# Patient Record
Sex: Male | Born: 1970 | Hispanic: Yes | Marital: Married | Smoking: Never smoker
Health system: Southern US, Community
[De-identification: ages and names within clinical notes are randomized; demographics above are authoritative.]

---

## 2005-05-04 ENCOUNTER — Emergency Department: Payer: Self-pay | Admitting: General Practice

## 2017-10-27 ENCOUNTER — Emergency Department
Admission: EM | Admit: 2017-10-27 | Discharge: 2017-10-27 | Disposition: A | Discharge status: Still In Hospital | Payer: Self-pay | Attending: Surgery | Admitting: Surgery

## 2017-10-27 ENCOUNTER — Other Ambulatory Visit: Payer: Self-pay

## 2017-10-27 ENCOUNTER — Emergency Department: Payer: Self-pay | Admitting: Certified Registered"

## 2017-10-27 ENCOUNTER — Emergency Department: Payer: Self-pay

## 2017-10-27 ENCOUNTER — Encounter
Admission: EM | Disposition: A | Discharge status: Still In Hospital | Payer: Self-pay | Source: Home / Self Care | Attending: Emergency Medicine

## 2017-10-27 ENCOUNTER — Encounter: Payer: Self-pay | Admitting: Emergency Medicine

## 2017-10-27 DIAGNOSIS — Y9389 Activity, other specified: Secondary | ICD-10-CM | POA: Insufficient documentation

## 2017-10-27 DIAGNOSIS — S61019A Laceration without foreign body of unspecified thumb without damage to nail, initial encounter: Secondary | ICD-10-CM

## 2017-10-27 DIAGNOSIS — S61011A Laceration without foreign body of right thumb without damage to nail, initial encounter: Secondary | ICD-10-CM

## 2017-10-27 DIAGNOSIS — S62501B Fracture of unspecified phalanx of right thumb, initial encounter for open fracture: Secondary | ICD-10-CM

## 2017-10-27 DIAGNOSIS — Z79899 Other long term (current) drug therapy: Secondary | ICD-10-CM | POA: Insufficient documentation

## 2017-10-27 DIAGNOSIS — S62511B Displaced fracture of proximal phalanx of right thumb, initial encounter for open fracture: Secondary | ICD-10-CM | POA: Insufficient documentation

## 2017-10-27 DIAGNOSIS — X58XXXA Exposure to other specified factors, initial encounter: Secondary | ICD-10-CM | POA: Insufficient documentation

## 2017-10-27 HISTORY — PX: OPEN REDUCTION INTERNAL FIXATION (ORIF) HAND: SHX5991

## 2017-10-27 SURGERY — OPEN REDUCTION INTERNAL FIXATION (ORIF) HAND
Anesthesia: General | Site: Thumb | Laterality: Right

## 2017-10-27 MED ORDER — GLYCOPYRROLATE 0.2 MG/ML IJ SOLN
INTRAMUSCULAR | Status: DC | PRN
  Administered 2017-10-27: 0.2 mg via INTRAVENOUS

## 2017-10-27 MED ORDER — LIDOCAINE HCL (CARDIAC) PF 100 MG/5ML IV SOSY
PREFILLED_SYRINGE | INTRAVENOUS | Status: DC | PRN
  Administered 2017-10-27: 50 mg via INTRAVENOUS

## 2017-10-27 MED ORDER — CEFAZOLIN SODIUM-DEXTROSE 1-4 GM/50ML-% IV SOLN
1.0000 g | Freq: Once | INTRAVENOUS | Status: AC
  Administered 2017-10-27: 1 g via INTRAVENOUS
  Filled 2017-10-27: qty 50

## 2017-10-27 MED ORDER — ONDANSETRON HCL 4 MG/2ML IJ SOLN
INTRAMUSCULAR | Status: AC
  Filled 2017-10-27: qty 2

## 2017-10-27 MED ORDER — DEXAMETHASONE SODIUM PHOSPHATE 10 MG/ML IJ SOLN
INTRAMUSCULAR | Status: DC | PRN
  Administered 2017-10-27: 5 mg via INTRAVENOUS

## 2017-10-27 MED ORDER — HYDROCODONE-ACETAMINOPHEN 5-325 MG PO TABS: 1.0000 | ORAL_TABLET | Freq: Four times a day (QID) | ORAL | 0 refills | Status: DC | PRN

## 2017-10-27 MED ORDER — FENTANYL CITRATE (PF) 100 MCG/2ML IJ SOLN
INTRAMUSCULAR | Status: AC
  Filled 2017-10-27: qty 2

## 2017-10-27 MED ORDER — SULFAMETHOXAZOLE-TRIMETHOPRIM 800-160 MG PO TABS: 1.0000 | ORAL_TABLET | Freq: Two times a day (BID) | ORAL | 0 refills | Status: AC

## 2017-10-27 MED ORDER — ROCURONIUM BROMIDE 50 MG/5ML IV SOLN
INTRAVENOUS | Status: AC
  Filled 2017-10-27: qty 1

## 2017-10-27 MED ORDER — CEFAZOLIN SODIUM-DEXTROSE 1-4 GM/50ML-% IV SOLN
1.0000 g | Freq: Once | INTRAVENOUS | Status: AC
  Administered 2017-10-27: 2 g via INTRAVENOUS

## 2017-10-27 MED ORDER — FENTANYL CITRATE (PF) 100 MCG/2ML IJ SOLN
INTRAMUSCULAR | Status: DC | PRN
  Administered 2017-10-27: 50 ug via INTRAVENOUS
  Administered 2017-10-27: 100 ug via INTRAVENOUS
  Administered 2017-10-27: 50 ug via INTRAVENOUS

## 2017-10-27 MED ORDER — CEFAZOLIN SODIUM 1 G IJ SOLR
INTRAMUSCULAR | Status: AC
  Filled 2017-10-27: qty 20

## 2017-10-27 MED ORDER — TETANUS-DIPHTH-ACELL PERTUSSIS 5-2.5-18.5 LF-MCG/0.5 IM SUSP
0.5000 mL | Freq: Once | INTRAMUSCULAR | Status: AC
  Administered 2017-10-27: 0.5 mL via INTRAMUSCULAR
  Filled 2017-10-27: qty 0.5

## 2017-10-27 MED ORDER — SUGAMMADEX SODIUM 200 MG/2ML IV SOLN
INTRAVENOUS | Status: AC
  Filled 2017-10-27: qty 2

## 2017-10-27 MED ORDER — HYDROMORPHONE HCL 1 MG/ML IJ SOLN
1.0000 mg | Freq: Once | INTRAMUSCULAR | Status: AC
  Administered 2017-10-27: 1 mg via INTRAVENOUS
  Filled 2017-10-27: qty 1

## 2017-10-27 MED ORDER — LIDOCAINE HCL (PF) 1 % IJ SOLN
10.0000 mL | Freq: Once | INTRAMUSCULAR | Status: AC
  Administered 2017-10-27: 10 mL
  Filled 2017-10-27: qty 10

## 2017-10-27 MED ORDER — FENTANYL CITRATE (PF) 100 MCG/2ML IJ SOLN
25.0000 ug | INTRAMUSCULAR | Status: DC | PRN
  Administered 2017-10-27 (×2): 25 ug via INTRAVENOUS

## 2017-10-27 MED ORDER — ONDANSETRON HCL 4 MG/2ML IJ SOLN
INTRAMUSCULAR | Status: DC | PRN
  Administered 2017-10-27: 4 mg via INTRAVENOUS

## 2017-10-27 MED ORDER — ROCURONIUM BROMIDE 100 MG/10ML IV SOLN
INTRAVENOUS | Status: DC | PRN
  Administered 2017-10-27: 25 mg via INTRAVENOUS

## 2017-10-27 MED ORDER — ONDANSETRON HCL 4 MG/2ML IJ SOLN: 4.0000 mg | Freq: Once | INTRAMUSCULAR | Status: DC | PRN

## 2017-10-27 MED ORDER — PROPOFOL 10 MG/ML IV BOLUS
INTRAVENOUS | Status: AC
  Filled 2017-10-27: qty 20

## 2017-10-27 MED ORDER — GLYCOPYRROLATE 0.2 MG/ML IJ SOLN
INTRAMUSCULAR | Status: AC
  Filled 2017-10-27: qty 1

## 2017-10-27 MED ORDER — PROPOFOL 10 MG/ML IV BOLUS
INTRAVENOUS | Status: DC | PRN
  Administered 2017-10-27: 200 mg via INTRAVENOUS

## 2017-10-27 MED ORDER — SUGAMMADEX SODIUM 200 MG/2ML IV SOLN
INTRAVENOUS | Status: DC | PRN
  Administered 2017-10-27: 200 mg via INTRAVENOUS

## 2017-10-27 MED ORDER — MIDAZOLAM HCL 2 MG/2ML IJ SOLN
INTRAMUSCULAR | Status: AC
  Filled 2017-10-27: qty 2

## 2017-10-27 MED ORDER — MIDAZOLAM HCL 2 MG/2ML IJ SOLN
INTRAMUSCULAR | Status: DC | PRN
  Administered 2017-10-27: 2 mg via INTRAVENOUS

## 2017-10-27 MED ORDER — LIDOCAINE HCL (PF) 2 % IJ SOLN
INTRAMUSCULAR | Status: AC
  Filled 2017-10-27: qty 10

## 2017-10-27 MED ORDER — LACTATED RINGERS IV SOLN
INTRAVENOUS | Status: DC | PRN
  Administered 2017-10-27: 15:00:00 via INTRAVENOUS

## 2017-10-27 MED ORDER — SUCCINYLCHOLINE CHLORIDE 20 MG/ML IJ SOLN
INTRAMUSCULAR | Status: DC | PRN
  Administered 2017-10-27: 100 mg via INTRAVENOUS

## 2017-10-27 SURGICAL SUPPLY — 46 items
BLADE SURG SZ10 CARB STEEL (BLADE) ×2 IMPLANT
BNDG COHESIVE 4X5 TAN STRL (GAUZE/BANDAGES/DRESSINGS) ×2 IMPLANT
BNDG ESMARK 4X12 TAN STRL LF (GAUZE/BANDAGES/DRESSINGS) ×2 IMPLANT
CANISTER SUCT 1200ML W/VALVE (MISCELLANEOUS) ×2 IMPLANT
CAST PADDING 3X4FT ST 30246 (SOFTGOODS) ×1
CHLORAPREP W/TINT 26ML (MISCELLANEOUS) IMPLANT
COVER PIN YLW 0.028-062 (MISCELLANEOUS) ×4 IMPLANT
CUFF TOURN 18 STER (MISCELLANEOUS) ×2 IMPLANT
CUFF TOURN 24 STER (MISCELLANEOUS) IMPLANT
DURAPREP 26ML APPLICATOR (WOUND CARE) ×2 IMPLANT
ELECT REM PT RETURN 9FT ADLT (ELECTROSURGICAL) ×2
ELECTRODE REM PT RTRN 9FT ADLT (ELECTROSURGICAL) ×1 IMPLANT
GLOVE BIO SURGEON STRL SZ8 (GLOVE) ×2 IMPLANT
GLOVE BIOGEL PI IND STRL 7.0 (GLOVE) ×3 IMPLANT
GLOVE BIOGEL PI INDICATOR 7.0 (GLOVE) ×3
GLOVE INDICATOR 8.0 STRL GRN (GLOVE) ×2 IMPLANT
GLOVE SURG ORTHO 8.5 STRL (GLOVE) ×2 IMPLANT
GOWN STRL REUS W/ TWL LRG LVL3 (GOWN DISPOSABLE) ×1 IMPLANT
GOWN STRL REUS W/ TWL XL LVL3 (GOWN DISPOSABLE) ×1 IMPLANT
GOWN STRL REUS W/TWL LRG LVL3 (GOWN DISPOSABLE) ×1
GOWN STRL REUS W/TWL XL LVL3 (GOWN DISPOSABLE) ×1
KIT TURNOVER KIT A (KITS) ×2 IMPLANT
LABEL OR SOLS (LABEL) ×2 IMPLANT
NDL SAFETY ECLIPSE 18X1.5 (NEEDLE) ×1 IMPLANT
NEEDLE HYPO 18GX1.5 SHARP (NEEDLE) ×1
NS IRRIG 1000ML POUR BTL (IV SOLUTION) ×2 IMPLANT
PACK EXTREMITY ARMC (MISCELLANEOUS) ×2 IMPLANT
PAD CAST CTTN 3X4 STRL (SOFTGOODS) ×1 IMPLANT
PAD CAST CTTN 4X4 STRL (SOFTGOODS) IMPLANT
PADDING CAST COTTON 4X4 STRL (SOFTGOODS)
SPLINT CAST 1 STEP 3X12 (MISCELLANEOUS) ×2 IMPLANT
SPONGE LAP 18X18 RF (DISPOSABLE) ×2 IMPLANT
STAPLER SKIN PROX 35W (STAPLE) IMPLANT
STOCKINETTE BIAS CUT 4 980044 (GAUZE/BANDAGES/DRESSINGS) ×2 IMPLANT
STOCKINETTE IMPERVIOUS 9X36 MD (GAUZE/BANDAGES/DRESSINGS) ×2 IMPLANT
SUT ETHILON 4-0 (SUTURE)
SUT ETHILON 4-0 FS2 18XMFL BLK (SUTURE)
SUT PROLENE 4 0 PS 2 18 (SUTURE) ×2 IMPLANT
SUT VIC AB 2-0 CT1 36 (SUTURE) IMPLANT
SUT VIC AB 4-0 SH 27 (SUTURE)
SUT VIC AB 4-0 SH 27XANBCTRL (SUTURE) IMPLANT
SUT VICRYL+ 3-0 36IN CT-1 (SUTURE) IMPLANT
SUTURE ETHLN 4-0 FS2 18XMF BLK (SUTURE) IMPLANT
SYR 10ML LL (SYRINGE) ×2 IMPLANT
WIRE Z .045 C-WIRE SPADE TIP (WIRE) ×4 IMPLANT
c-wire .045 spade (1.14mm) ×4 IMPLANT

## 2017-10-27 NOTE — ED Triage Notes (Signed)
Pt presents via pov with laceration to left thumb with active bleeding upon presentation. Pt states he was working with a piece of equipment at home and was injured while using it. Pt alert & oriented; nad noted.

## 2017-10-27 NOTE — Progress Notes (Signed)
Dr. Henrene Hawking aware of bp 144/96, attempting manual now.  Reading of 138/90

## 2017-10-27 NOTE — ED Notes (Signed)
Pt remains  Npo

## 2017-10-27 NOTE — Discharge Instructions (Addendum)
PATIENT INSTRUCTIONS POST-ANESTHESIA  IMMEDIATELY FOLLOWING SURGERY:  Do not drive or operate machinery for the first twenty four hours after surgery.  Do not make any important decisions for twenty four hours after surgery or while taking narcotic pain medications or sedatives.  If you develop intractable nausea and vomiting or a severe headache please notify your doctor immediately.  FOLLOW-UP:  Please make an appointment with your surgeon as instructed. You do not need to follow up with anesthesia unless specifically instructed to do so.  WOUND CARE INSTRUCTIONS (if applicable):  Keep a dry clean dressing on the anesthesia/puncture wound site if there is drainage.  Once the wound has quit draining you may leave it open to air.  Generally you should leave the bandage intact for twenty four hours unless there is drainage.  If the epidural site drains for more than 36-48 hours please call the anesthesia department.  QUESTIONS?:  Please feel free to call your physician or the hospital operator if you have any questions, and they will be happy to assist you.      Orthopedic discharge instructions: Keep splint dry and intact. Keep hand elevated above heart level. Apply ice to affected area frequently. Take ibuprofen 600-800 mg TID with meals for 7-10 days, then as necessary. Take pain medication as prescribed or ES Tylenol when needed.  Take antibiotic every 8 hours as prescribed. Return for follow-up in 3-5 days for dressing change.   Call [facility] at [phone] to schedule appointment with Horris Latino, PA-C

## 2017-10-27 NOTE — ED Notes (Signed)
Dr Joice Lofts in  Explained  Procedure   Spanish interpretor    Rafael in    At bedside   Pt verbalizes  Knowledge

## 2017-10-27 NOTE — Anesthesia Post-op Follow-up Note (Signed)
Anesthesia QCDR form completed.        

## 2017-10-27 NOTE — ED Notes (Signed)
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

## 2017-10-27 NOTE — ED Notes (Signed)
Last po intake this am 0930

## 2017-10-27 NOTE — Consult Note (Signed)
ORTHOPAEDIC CONSULTATION  REQUESTING PHYSICIAN: Nita Sickle, MD  Chief Complaint:   Right thumb injury.  History of Present Illness: Reginald Parks is a 47 y.o. male in otherwise good health who was working on a lawnmower this morning when a spring apparently broke and struck his thumb, resulting in a deformity to his thumb.  He presented to the emergency room where x-rays demonstrated an essentially transverse/short oblique shaft fracture of the proximal phalanx of his right thumb.  The fracture is open.  The wound was irrigated thoroughly and the emergency room and he was given a gram of Ancef.  He notes moderate pain in the thumb, but denies any numbness to his thumb tip.  The patient also denies any associated injuries.  History reviewed. No pertinent past medical history. History reviewed. No pertinent surgical history. Social History   Socioeconomic History  . Marital status: Married    Spouse name: Not on file  . Number of children: Not on file  . Years of education: Not on file  . Highest education level: Not on file  Occupational History  . Not on file  Social Needs  . Financial resource strain: Not on file  . Food insecurity:    Worry: Not on file    Inability: Not on file  . Transportation needs:    Medical: Not on file    Non-medical: Not on file  Tobacco Use  . Smoking status: Never Smoker  . Smokeless tobacco: Never Used  Substance and Sexual Activity  . Alcohol use: Yes  . Drug use: Never  . Sexual activity: Not on file  Lifestyle  . Physical activity:    Days per week: Not on file    Minutes per session: Not on file  . Stress: Not on file  Relationships  . Social connections:    Talks on phone: Not on file    Gets together: Not on file    Attends religious service: Not on file    Active member of club or organization: Not on file    Attends meetings of clubs or organizations: Not on  file    Relationship status: Not on file  Other Topics Concern  . Not on file  Social History Narrative  . Not on file   History reviewed. No pertinent family history. No Known Allergies Prior to Admission medications   Not on File   Dg Finger Thumb Right  Result Date: 10/27/2017 CLINICAL DATA:  Thumb caught by retracting wire EXAM: RIGHT THUMB 2+V COMPARISON:  None. FINDINGS: Frontal, oblique, and lateral views obtained. There is an obliquely oriented fracture through the distal aspect of the first proximal phalanx with dorsal displacement and lateral angulation distally. No other fractures are evident. No dislocation. Joint spaces appear normal. No radiopaque foreign body or soft tissue air. IMPRESSION: Obliquely oriented fracture of the distal aspect of the fifth proximal phalanx with displacement and angulation at the fracture site. No other fractures are evident. No dislocation. No evident arthropathy. No radiopaque foreign body. Electronically Signed   By: Bretta Bang III M.D.   On: 10/27/2017 13:20    Positive ROS: All other systems have been reviewed and were otherwise negative with the exception of those mentioned in the HPI and as above.  Physical Exam: General:  Alert, no acute distress Psychiatric:  Patient is competent for consent with normal mood and affect   Cardiovascular:  No pedal edema Respiratory:  No wheezing, non-labored breathing GI:  Abdomen is soft and non-tender  Skin:  No lesions in the area of chief complaint Neurologic:  Sensation intact distally Lymphatic:  No axillary or cervical lymphadenopathy  Orthopedic Exam:  Orthopedic examination is limited to the right hand.  Skin inspection is notable for significant grease and dirt over his hand extending into the forearm region.  There is an obliquely oriented 1.5 cm laceration over the volo-ulnar aspect of the thumb midway between the MCP and IP joints.  The patient is able to actively flex and extend the  MCP and IP joints, although with pain.  Sensation is intact to light touch to the radial and ulnar aspects of his thumb tip.  He has good capillary refill to his thumb tip.  X-rays:  Recent AP, lateral, and oblique x-rays of the right thumb are available for review, and I reviewed them myself.  These films demonstrate a short oblique essentially transverse fracture through the proximal phalangeal shaft of the right thumb with displacement.  The fracture does not appear to involve either the IP or the MCP joints.  No lytic lesions or other bony abnormalities are identified.  Assessment: Open P1 fracture right thumb.  Plan: Treatment options have been discussed with the patient, including both surgical and nonsurgical options.  A medical interpreter was present for this discussion.  The patient would like to proceed with surgical intervention to include irrigation and debridement of the right thumb wound/fracture with reduction and percutaneous pinning of the P1 fracture.  The procedure has been discussed in detail with the patient, as have the potential risks (including bleeding, infection, nerve and blood vessel injury, persistent recurrent pain, stiffness of the thumb, malunion and/or nonunion, need for further surgery, blood clots, strokes, heart attacks and arrhythmias, etc.) and benefits.  The patient states his understanding and wishes to proceed.  A formal written consent has been obtained.  The patient last ate at 9:20 this morning.  However, I feel this is an emergency as the fracture is open and there is significant contamination of the hand.  I have discussed this with both the anesthesiologist and the patient, and both are agreeable to proceed as soon as possible.   Maryagnes Amos, MD  Beeper #:  [phone]  10/27/2017 2:28 PM

## 2017-10-27 NOTE — ED Provider Notes (Signed)
[facility] Emergency Department Provider Note  ____________________________________________   First MD Initiated Contact with Patient 10/27/17 1156     (approximate)  I have reviewed the triage vital signs and the nursing notes.   HISTORY  Chief Complaint Extremity Laceration   HPI Reginald Parks is a 47 y.o. male presents to the ED with complaint of laceration to his right thumb.  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  States that movement increases his pain.  He is uncertain as to his last tetanus.  Patient is right-hand dominant.  He rates his pain as a 5/10.  History reviewed. No pertinent past medical history.  There are no active problems to display for this patient.   History reviewed. No pertinent surgical history.  Prior to Admission medications   Medication Sig Start Date End Date Taking? Authorizing Provider  HYDROcodone-acetaminophen (NORCO/VICODIN) 5-325 MG tablet Take 1-2 tablets by mouth every 6 (six) hours as needed for moderate pain. 10/27/17   Poggi, Excell Seltzer, MD  sulfamethoxazole-trimethoprim (BACTRIM DS,SEPTRA DS) 800-160 MG tablet Take 1 tablet by mouth 2 (two) times daily. 10/27/17   Poggi, Excell Seltzer, MD    Allergies Patient has no known allergies.  History reviewed. No pertinent family history.  Social History Social History   Tobacco Use  . Smoking status: Never Smoker  . Smokeless tobacco: Never Used  Substance Use Topics  . Alcohol use: Yes  . Drug use: Never    Review of Systems Constitutional: No fever/chills Cardiovascular: Denies chest pain. Musculoskeletal: Positive right thumb pain. Skin: Positive laceration right thumb. Neurological: Negative for headaches, focal weakness or numbness. ___________________________________________   PHYSICAL EXAM:  VITAL SIGNS: ED Triage Vitals  Enc Vitals Group     BP 10/27/17 1135 133/90   Pulse Rate 10/27/17 1135 74     Resp 10/27/17 1135 18     Temp 10/27/17 1135 98.9 F (37.2 C)     Temp Source 10/27/17 1135 Oral     SpO2 10/27/17 1135 100 %     Weight 10/27/17 1136 200 lb (90.7 kg)     Height 10/27/17 1136 5\' 5"  (1.651 m)     Head Circumference --      Peak Flow --      Pain Score 10/27/17 1136 5     Pain Loc --      Pain Edu? --      Excl. in GC? --     Constitutional: Alert and oriented. Well appearing and in no acute distress. Eyes: Conjunctivae are normal.  Head: Atraumatic. Neck: No stridor.   Cardiovascular: Normal rate, regular rhythm. Grossly normal heart sounds.  Good peripheral circulation. Respiratory: Normal respiratory effort.  No retractions. Lungs CTAB. Musculoskeletal: Examination of right thumb there is questionable deformity and soft tissue swelling present.  There is a 1 cm laceration without active bleeding on the medial aspect of the thumb.  No foreign body noted.  Patient is limited with range of motion.  Motor sensory function intact.  Capillary refill is less than 3 seconds.  Skin of the right hand is covered with grease.  No other cuts are appreciated presently. Neurologic:  Normal speech and language. No gross focal neurologic deficits are appreciated.  Skin:  Skin is warm, dry.  Psychiatric: Mood and affect are normal. Speech and behavior are normal.  ____________________________________________   LABS (all labs ordered are listed, but  only abnormal results are displayed)  Labs Reviewed - No data to display  RADIOLOGY  ED MD interpretation:  Right thumb fracture distal aspect of the proximal phalanx with displacement and angulation.  No foreign body.  Official radiology report(s): Dg Finger Thumb Right  Result Date: 10/27/2017 CLINICAL DATA:  Thumb caught by retracting wire EXAM: RIGHT THUMB 2+V COMPARISON:  None. FINDINGS: Frontal, oblique, and lateral views obtained. There is an obliquely oriented fracture through the distal  aspect of the first proximal phalanx with dorsal displacement and lateral angulation distally. No other fractures are evident. No dislocation. Joint spaces appear normal. No radiopaque foreign body or soft tissue air. IMPRESSION: Obliquely oriented fracture of the distal aspect of the fifth proximal phalanx with displacement and angulation at the fracture site. No other fractures are evident. No dislocation. No evident arthropathy. No radiopaque foreign body. Electronically Signed   By: Bretta Bang III M.D.   On: 10/27/2017 13:20    ____________________________________________   PROCEDURES  Procedure(s) performed: None  Procedures  Critical Care performed: No  ____________________________________________   INITIAL IMPRESSION / ASSESSMENT AND PLAN / ED COURSE  As part of my medical decision making, I reviewed the following data within the electronic MEDICAL RECORD NUMBER Notes from prior ED visits and [place] Controlled Substance Database  Patient presents to the ED with injury to his right thumb.  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  He has a small laceration and deformity to his right thumb.  X-ray revealed angulated fracture of the distal phalanx.  Patient received IV Ancef and Dilaudid.  Digital block was not performed due to patient's pain being controlled.  Dr. Joice Lofts who is on for orthopedics was called and will see the patient.  Arrangements were made for the patient to have reduction in the OR.  ____________________________________________   FINAL CLINICAL IMPRESSION(S) / ED DIAGNOSES  Final diagnoses:  Laceration of right thumb without foreign body without damage to nail, initial encounter  Open fracture dislocation of right thumb, initial encounter     ED Discharge Orders         Ordered    HYDROcodone-acetaminophen (NORCO/VICODIN) 5-325 MG tablet  Every 6 hours PRN     10/27/17 1441    sulfamethoxazole-trimethoprim (BACTRIM  DS,SEPTRA DS) 800-160 MG tablet  2 times daily     10/27/17 1441           Note:  This document was prepared using Dragon voice recognition software and may include unintentional dictation errors.    Tommi Rumps, PA-C 10/27/17 1811    Nita Sickle, MD 10/29/17 1520

## 2017-10-27 NOTE — ED Notes (Signed)
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

## 2017-10-27 NOTE — ED Notes (Signed)
Pt remains NPO.

## 2017-10-27 NOTE — Anesthesia Preprocedure Evaluation (Signed)
Anesthesia Evaluation  Patient identified by MRN, date of birth, ID band Patient awake, Patient confused and Patient unresponsive    Reviewed: Allergy & Precautions, NPO status , Patient's Chart, lab work & pertinent test results  History of Anesthesia Complications Negative for: history of anesthetic complications  Airway Mallampati: III       Dental   Pulmonary neg sleep apnea, neg COPD,           Cardiovascular (-) hypertension(-) Past MI and (-) CHF (-) dysrhythmias (-) Valvular Problems/Murmurs     Neuro/Psych neg Seizures    GI/Hepatic Neg liver ROS, neg GERD  ,  Endo/Other  neg diabetes  Renal/GU negative Renal ROS     Musculoskeletal   Abdominal   Peds  Hematology   Anesthesia Other Findings   Reproductive/Obstetrics                             Anesthesia Physical Anesthesia Plan  ASA: II and emergent  Anesthesia Plan: General   Post-op Pain Management:    Induction: Rapid sequence and Intravenous  PONV Risk Score and Plan: 2 and Dexamethasone and Ondansetron  Airway Management Planned: Oral ETT  Additional Equipment:   Intra-op Plan:   Post-operative Plan:   Informed Consent: I have reviewed the patients History and Physical, chart, labs and discussed the procedure including the risks, benefits and alternatives for the proposed anesthesia with the patient or authorized representative who has indicated his/her understanding and acceptance.     Plan Discussed with:   Anesthesia Plan Comments:         Anesthesia Quick Evaluation

## 2017-10-27 NOTE — ED Notes (Signed)
Spanish interpretor  In   Interpret but patient speaks  Good  [place]

## 2017-10-27 NOTE — Progress Notes (Signed)
Dr Henrene Hawking aware of elevated diastolic bp.  Will attempt manual bp reading after locating one available.

## 2017-10-27 NOTE — ED Notes (Signed)
Or team here  To take patient to surgery

## 2017-10-27 NOTE — ED Notes (Signed)
Dressing and  Thumb splint applied  r  Thumb

## 2017-10-27 NOTE — Op Note (Signed)
10/27/2017  4:03 PM  Patient:   Reginald Parks  Pre-Op Diagnosis:   Open displaced extra-articular P1 fracture, right thumb.  Post-Op Diagnosis:   Same  Procedure:   Irrigation and debridement with closed reduction and percutaneous pinning of P1 fracture, right thumb.  Surgeon:   Maryagnes Amos, MD  Assistant:   None  Anesthesia:   GET  Findings:   As above.  Complications:   None  Fluids:   400 cc crystalloid  EBL:   10 cc  UOP:   None  TT:   31 minutes at 250 mmHg  Drains:   None  Closure:   4-0 Prolene interrupted sutures  Implants:   0.045 K wires x2  Brief Clinical Note:   The patient is a 47 year old male who sustained the above-noted injury this morning while working on his lawnmower.  Apparently a spring gave way and smacked his thumb.  He presented to the emergency room where x-rays demonstrated the above-noted injury.  He presents at this time for definitive management of this injury.  Procedure:   The patient was brought into the operating room and lain in the supine position.  After adequate general endotracheal intubation and anesthesia were obtained, the patient's right hand was scrubbed thoroughly with a chlorhexidine soaked scrub brush as he had significant dirt and oil all over his hands and forearms.  The right hand and upper extremity was prepped with ChloraPrep solution before being draped sterilely.  Preoperative antibiotics were administered.  The wound on the ulnar aspect of the thumb was debrided sharply with a 15 blade before being irrigated thoroughly using 1 L of sterile saline solution using bulb syringe irrigation.  The fracture was reduced and the adequacy of reduction verified using FluoroScan imaging in AP and lateral projections.  The fracture was stabilized using crossed 0.045 K wires inserted from distal to proximal.  Again, the adequacy of hardware position and fracture reduction was verified in AP and lateral projections using FluoroScan  imaging and found to be excellent.  The pins were cut short and pin caps applied to the pins which were left outside the skin.  A sterile bulky dressing was applied to the thumb before the patient was placed into a thumb spica splint.  The patient was then awakened, extubated, and returned to the recovery room in satisfactory condition after tolerating the procedure well.

## 2017-10-27 NOTE — Anesthesia Postprocedure Evaluation (Signed)
Anesthesia Post Note  Patient: Reginald Parks  Procedure(s) Performed: OPEN REDUCTION INTERNAL FIXATION (ORIF) -RIGHT THUMB (Right Thumb)  Patient location during evaluation: PACU Anesthesia Type: General Level of consciousness: awake and alert Pain management: pain level controlled Vital Signs Assessment: post-procedure vital signs reviewed and stable Respiratory status: spontaneous breathing and respiratory function stable Cardiovascular status: stable Anesthetic complications: no     Last Vitals:  Vitals:   10/27/17 1701 10/27/17 1727  BP: 138/90 138/90  Pulse: 75 75  Resp: 10 14  Temp:    SpO2: 97% 96%    Last Pain:  Vitals:   10/27/17 1727  TempSrc:   PainSc: 3                  KEPHART,WILLIAM K

## 2017-10-27 NOTE — ED Notes (Signed)
Finger soaked.

## 2017-10-27 NOTE — Transfer of Care (Signed)
Immediate Anesthesia Transfer of Care Note  Patient: Reginald Parks  Procedure(s) Performed: OPEN REDUCTION INTERNAL FIXATION (ORIF) -RIGHT THUMB (Right Thumb)  Patient Location: PACU  Anesthesia Type:General  Level of Consciousness: awake  Airway & Oxygen Therapy: Patient Spontanous Breathing and Patient connected to face mask oxygen  Post-op Assessment: Report given to RN and Post -op Vital signs reviewed and stable  Post vital signs: Reviewed  Last Vitals:  Vitals Value Taken Time  BP 137/97 10/27/2017  4:05 PM  Temp    Pulse 82 10/27/2017  4:04 PM  Resp 17 10/27/2017  4:04 PM  SpO2 92 % 10/27/2017  4:04 PM  Vitals shown include unvalidated device data.  Last Pain:  Vitals:   10/27/17 1420  TempSrc: Oral  PainSc:          Complications: No apparent anesthesia complications

## 2017-10-27 NOTE — Progress Notes (Signed)
Gabriel Rainwater. At bedside for discharge instructions.

## 2017-10-27 NOTE — Anesthesia Procedure Notes (Signed)
Procedure Name: Intubation Performed by: Mathews Argyle, CRNA Pre-anesthesia Checklist: Patient identified, Patient being monitored, Timeout performed, Emergency Drugs available and Suction available Patient Re-evaluated:Patient Re-evaluated prior to induction Oxygen Delivery Method: Circle system utilized Preoxygenation: Pre-oxygenation with 100% oxygen Induction Type: IV induction, Rapid sequence and Cricoid Pressure applied Laryngoscope Size: 3 and McGraph Grade View: Grade I Tube type: Oral Tube size: 7.5 mm Number of attempts: 1 Airway Equipment and Method: Stylet and Video-laryngoscopy Placement Confirmation: ETT inserted through vocal cords under direct vision,  positive ETCO2 and breath sounds checked- equal and bilateral Secured at: 21 cm Tube secured with: Tape Dental Injury: Teeth and Oropharynx as per pre-operative assessment

## 2017-10-29 ENCOUNTER — Encounter: Payer: Self-pay | Admitting: Surgery

## 2019-10-24 ENCOUNTER — Emergency Department: Payer: Self-pay

## 2019-10-24 ENCOUNTER — Other Ambulatory Visit: Payer: Self-pay

## 2019-10-24 ENCOUNTER — Encounter: Payer: Self-pay | Admitting: *Deleted

## 2019-10-24 ENCOUNTER — Emergency Department
Admission: EM | Admit: 2019-10-24 | Discharge: 2019-10-24 | Disposition: A | Discharge status: Home / Self Care | Payer: Self-pay | Attending: Emergency Medicine | Admitting: Emergency Medicine

## 2019-10-24 DIAGNOSIS — M25531 Pain in right wrist: Secondary | ICD-10-CM

## 2019-10-24 DIAGNOSIS — Z20822 Contact with and (suspected) exposure to covid-19: Secondary | ICD-10-CM | POA: Insufficient documentation

## 2019-10-24 DIAGNOSIS — S52352A Displaced comminuted fracture of shaft of radius, left arm, initial encounter for closed fracture: Secondary | ICD-10-CM | POA: Insufficient documentation

## 2019-10-24 DIAGNOSIS — W208XXA Other cause of strike by thrown, projected or falling object, initial encounter: Secondary | ICD-10-CM | POA: Insufficient documentation

## 2019-10-24 DIAGNOSIS — Y9389 Activity, other specified: Secondary | ICD-10-CM | POA: Insufficient documentation

## 2019-10-24 DIAGNOSIS — S61412A Laceration without foreign body of left hand, initial encounter: Secondary | ICD-10-CM | POA: Insufficient documentation

## 2019-10-24 DIAGNOSIS — S60221A Contusion of right hand, initial encounter: Secondary | ICD-10-CM | POA: Insufficient documentation

## 2019-10-24 DIAGNOSIS — M79642 Pain in left hand: Secondary | ICD-10-CM

## 2019-10-24 LAB — BASIC METABOLIC PANEL
Anion gap: 9 (ref 5–15)
BUN: 22 mg/dL — ABNORMAL HIGH (ref 6–20)
CO2: 26 mmol/L (ref 22–32)
Calcium: 8.5 mg/dL — ABNORMAL LOW (ref 8.9–10.3)
Chloride: 103 mmol/L (ref 98–111)
Creatinine, Ser: 0.65 mg/dL (ref 0.61–1.24)
GFR calc Af Amer: >60 mL/min (ref >60)
GFR calc non Af Amer: >60 mL/min (ref >60)
Glucose, Bld: 124 mg/dL — ABNORMAL HIGH (ref 70–99)
Potassium: 3.6 mmol/L (ref 3.5–5.1)
Sodium: 138 mmol/L (ref 135–145)

## 2019-10-24 LAB — CBC
HCT: 36.9 % — ABNORMAL LOW (ref 39.0–52.0)
Hemoglobin: 12.9 g/dL — ABNORMAL LOW (ref 13.0–17.0)
MCH: 31.4 pg (ref 26.0–34.0)
MCHC: 35 g/dL (ref 30.0–36.0)
MCV: 89.8 fL (ref 80.0–100.0)
Platelets: 262 10*3/uL (ref 150–400)
RBC: 4.11 MIL/uL — ABNORMAL LOW (ref 4.22–5.81)
RDW: 14 % (ref 11.5–15.5)
WBC: 10.2 10*3/uL (ref 4.0–10.5)
nRBC: 0 % (ref 0.0–0.2)

## 2019-10-24 LAB — RESPIRATORY PANEL BY RT PCR (FLU A&B, COVID)
Influenza A by PCR: NEGATIVE
Influenza B by PCR: NEGATIVE
SARS Coronavirus 2 by RT PCR: NEGATIVE

## 2019-10-24 MED ORDER — LIDOCAINE HCL (PF) 1 % IJ SOLN
INTRAMUSCULAR | Status: AC
  Administered 2019-10-24: 5 mL via INTRADERMAL
  Filled 2019-10-24: qty 5

## 2019-10-24 MED ORDER — CEFAZOLIN SODIUM-DEXTROSE 2-4 GM/100ML-% IV SOLN
2.0000 g | Freq: Once | INTRAVENOUS | Status: AC
  Administered 2019-10-24: 2 g via INTRAVENOUS
  Filled 2019-10-24 (×2): qty 100

## 2019-10-24 MED ORDER — FENTANYL CITRATE (PF) 100 MCG/2ML IJ SOLN
50.0000 ug | Freq: Once | INTRAMUSCULAR | Status: AC
  Administered 2019-10-24: 50 ug via INTRAVENOUS
  Filled 2019-10-24: qty 2

## 2019-10-24 MED ORDER — CEPHALEXIN 500 MG PO CAPS: 500.0000 mg | ORAL_CAPSULE | Freq: Three times a day (TID) | ORAL | 0 refills | Status: AC

## 2019-10-24 MED ORDER — ONDANSETRON 4 MG PO TBDP: 4.0000 mg | ORAL_TABLET | Freq: Three times a day (TID) | ORAL | 0 refills | Status: AC | PRN

## 2019-10-24 MED ORDER — LIDOCAINE HCL 1 % IJ SOLN: 5.0000 mL | Freq: Once | INTRAMUSCULAR | Status: AC

## 2019-10-24 MED ORDER — BUPIVACAINE HCL 0.25 % IJ SOLN
10.0000 mL | Freq: Once | INTRAMUSCULAR | Status: AC
  Administered 2019-10-24: 10 mL
  Filled 2019-10-24: qty 10

## 2019-10-24 MED ORDER — LIDOCAINE HCL (PF) 1 % IJ SOLN: 10.0000 mL | Freq: Once | INTRAMUSCULAR | Status: DC

## 2019-10-24 MED ORDER — LIDOCAINE HCL (PF) 1 % IJ SOLN: 10.0000 mL | Freq: Once | INTRAMUSCULAR | Status: AC

## 2019-10-24 MED ORDER — BUPIVACAINE HCL (PF) 0.5 % IJ SOLN: 10.0000 mL | Freq: Once | INTRAMUSCULAR | Status: DC

## 2019-10-24 MED ORDER — HYDROMORPHONE HCL 1 MG/ML IJ SOLN
1.0000 mg | Freq: Once | INTRAMUSCULAR | Status: AC
  Administered 2019-10-24: 1 mg via INTRAVENOUS
  Filled 2019-10-24: qty 1

## 2019-10-24 MED ORDER — SODIUM CHLORIDE 0.9 % IV SOLN: Freq: Once | INTRAVENOUS | Status: AC

## 2019-10-24 MED ORDER — OXYCODONE-ACETAMINOPHEN 5-325 MG PO TABS: 1.0000 | ORAL_TABLET | Freq: Four times a day (QID) | ORAL | 0 refills | Status: DC | PRN

## 2019-10-24 MED ORDER — LIDOCAINE HCL (PF) 1 % IJ SOLN
INTRAMUSCULAR | Status: AC
  Administered 2019-10-24: 5 mL
  Filled 2019-10-24: qty 5

## 2019-10-24 MED ORDER — CEFAZOLIN SODIUM-DEXTROSE 1-4 GM/50ML-% IV SOLN
1.0000 g | Freq: Once | INTRAVENOUS | Status: DC
  Filled 2019-10-24: qty 50

## 2019-10-24 NOTE — ED Notes (Signed)
See triage note for assessment 

## 2019-10-24 NOTE — ED Notes (Signed)
IVC/  PENDING  PLACEMENT 

## 2019-10-24 NOTE — Discharge Instructions (Signed)
Please call Dr. Rosita Kea office as soon as possible.  Take Keflex three times daily for the next seven days. Have sutures removed in seven days.  You have been prescribed percocet for pain.

## 2019-10-24 NOTE — ED Notes (Signed)
Interpreter [place] [phone] used for DC instructions.

## 2019-10-24 NOTE — ED Notes (Signed)
Size lg shoulder immobilizer applied to right shoulder.

## 2019-10-24 NOTE — ED Triage Notes (Signed)
Per EMS report, patient was working on a tire of a Paediatric nurse which then blew off the rim. Patient was thrown back on his back. Obvious deformity to right wrist, laceration on palm of right hand. Patient arrived with a splint on right wrist. Left hand is swollen with laceration on dorsum of left hand which is wrapped with gauze.
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
## 2019-10-24 NOTE — ED Notes (Signed)
Sensation intact on right and left hands.

## 2019-10-27 ENCOUNTER — Other Ambulatory Visit: Payer: Self-pay | Admitting: Orthopedic Surgery

## 2019-10-27 ENCOUNTER — Other Ambulatory Visit: Payer: Self-pay | Attending: Orthopedic Surgery

## 2019-10-28 ENCOUNTER — Ambulatory Visit: Payer: Self-pay | Admitting: Certified Registered"

## 2019-10-28 ENCOUNTER — Encounter
Admission: RE | Disposition: A | Discharge status: Home / Self Care | Payer: Self-pay | Source: Home / Self Care | Attending: Orthopedic Surgery

## 2019-10-28 ENCOUNTER — Ambulatory Visit
Admission: RE | Admit: 2019-10-28 | Discharge: 2019-10-28 | Disposition: A | Discharge status: Home / Self Care | Payer: Self-pay | Attending: Orthopedic Surgery | Admitting: Orthopedic Surgery

## 2019-10-28 ENCOUNTER — Encounter: Payer: Self-pay | Admitting: Orthopedic Surgery

## 2019-10-28 ENCOUNTER — Ambulatory Visit: Payer: Self-pay

## 2019-10-28 ENCOUNTER — Other Ambulatory Visit: Payer: Self-pay

## 2019-10-28 DIAGNOSIS — X58XXXA Exposure to other specified factors, initial encounter: Secondary | ICD-10-CM | POA: Insufficient documentation

## 2019-10-28 DIAGNOSIS — Y9389 Activity, other specified: Secondary | ICD-10-CM | POA: Insufficient documentation

## 2019-10-28 DIAGNOSIS — Z79899 Other long term (current) drug therapy: Secondary | ICD-10-CM | POA: Insufficient documentation

## 2019-10-28 DIAGNOSIS — S52551A Other extraarticular fracture of lower end of right radius, initial encounter for closed fracture: Secondary | ICD-10-CM | POA: Insufficient documentation

## 2019-10-28 DIAGNOSIS — Z8781 Personal history of (healed) traumatic fracture: Secondary | ICD-10-CM

## 2019-10-28 HISTORY — PX: OPEN REDUCTION INTERNAL FIXATION (ORIF) DISTAL RADIAL FRACTURE: SHX5989

## 2019-10-28 SURGERY — OPEN REDUCTION INTERNAL FIXATION (ORIF) DISTAL RADIUS FRACTURE
Anesthesia: General | Laterality: Right

## 2019-10-28 MED ORDER — DEXAMETHASONE SODIUM PHOSPHATE 10 MG/ML IJ SOLN
INTRAMUSCULAR | Status: AC
  Filled 2019-10-28: qty 1

## 2019-10-28 MED ORDER — MIDAZOLAM HCL 2 MG/2ML IJ SOLN
INTRAMUSCULAR | Status: AC
  Filled 2019-10-28: qty 2

## 2019-10-28 MED ORDER — FENTANYL CITRATE (PF) 100 MCG/2ML IJ SOLN
INTRAMUSCULAR | Status: AC
  Administered 2019-10-28: 25 ug via INTRAVENOUS
  Filled 2019-10-28: qty 2

## 2019-10-28 MED ORDER — LACTATED RINGERS IV SOLN: INTRAVENOUS | Status: DC

## 2019-10-28 MED ORDER — ONDANSETRON HCL 4 MG PO TABS: 4.0000 mg | ORAL_TABLET | Freq: Four times a day (QID) | ORAL | Status: DC | PRN

## 2019-10-28 MED ORDER — PROPOFOL 10 MG/ML IV BOLUS
INTRAVENOUS | Status: DC | PRN
  Administered 2019-10-28: 200 mg via INTRAVENOUS

## 2019-10-28 MED ORDER — DEXAMETHASONE SODIUM PHOSPHATE 10 MG/ML IJ SOLN
INTRAMUSCULAR | Status: DC | PRN
  Administered 2019-10-28: 10 mg via INTRAVENOUS

## 2019-10-28 MED ORDER — LIDOCAINE HCL (PF) 2 % IJ SOLN
INTRAMUSCULAR | Status: DC | PRN
  Administered 2019-10-28: 50 mg

## 2019-10-28 MED ORDER — FENTANYL CITRATE (PF) 100 MCG/2ML IJ SOLN
INTRAMUSCULAR | Status: AC
Start: 2019-10-28 — End: 2019-10-28
  Administered 2019-10-28: 25 ug via INTRAVENOUS
  Filled 2019-10-28: qty 2

## 2019-10-28 MED ORDER — METOCLOPRAMIDE HCL 5 MG/ML IJ SOLN: 5.0000 mg | Freq: Three times a day (TID) | INTRAMUSCULAR | Status: DC | PRN

## 2019-10-28 MED ORDER — SODIUM CHLORIDE 0.9 % IV SOLN: INTRAVENOUS | Status: DC

## 2019-10-28 MED ORDER — FENTANYL CITRATE (PF) 100 MCG/2ML IJ SOLN
25.0000 ug | INTRAMUSCULAR | Status: AC | PRN
  Administered 2019-10-28 (×4): 25 ug via INTRAVENOUS

## 2019-10-28 MED ORDER — CHLORHEXIDINE GLUCONATE 0.12 % MT SOLN
OROMUCOSAL | Status: AC
  Administered 2019-10-28: 15 mL via OROMUCOSAL
  Filled 2019-10-28: qty 15

## 2019-10-28 MED ORDER — GLYCOPYRROLATE 0.2 MG/ML IJ SOLN
INTRAMUSCULAR | Status: DC | PRN
  Administered 2019-10-28: .2 mg via INTRAVENOUS

## 2019-10-28 MED ORDER — ONDANSETRON HCL 4 MG/2ML IJ SOLN
INTRAMUSCULAR | Status: DC | PRN
  Administered 2019-10-28: 4 mg via INTRAVENOUS

## 2019-10-28 MED ORDER — MIDAZOLAM HCL 5 MG/5ML IJ SOLN
INTRAMUSCULAR | Status: DC | PRN
  Administered 2019-10-28: 2 mg via INTRAVENOUS

## 2019-10-28 MED ORDER — KETAMINE HCL 50 MG/ML IJ SOLN
INTRAMUSCULAR | Status: DC | PRN
  Administered 2019-10-28: 50 mg via INTRAMUSCULAR

## 2019-10-28 MED ORDER — METOCLOPRAMIDE HCL 10 MG PO TABS: 5.0000 mg | ORAL_TABLET | Freq: Three times a day (TID) | ORAL | Status: DC | PRN

## 2019-10-28 MED ORDER — ONDANSETRON HCL 4 MG/2ML IJ SOLN: 4.0000 mg | Freq: Once | INTRAMUSCULAR | Status: DC | PRN

## 2019-10-28 MED ORDER — OXYCODONE-ACETAMINOPHEN 5-325 MG PO TABS: 1.0000 | ORAL_TABLET | Freq: Four times a day (QID) | ORAL | 0 refills | Status: AC | PRN

## 2019-10-28 MED ORDER — KETOROLAC TROMETHAMINE 30 MG/ML IJ SOLN
INTRAMUSCULAR | Status: DC | PRN
  Administered 2019-10-28: 30 mg via INTRAVENOUS

## 2019-10-28 MED ORDER — CEFAZOLIN SODIUM-DEXTROSE 2-4 GM/100ML-% IV SOLN
INTRAVENOUS | Status: AC
  Filled 2019-10-28: qty 100

## 2019-10-28 MED ORDER — ORAL CARE MOUTH RINSE: 15.0000 mL | Freq: Once | OROMUCOSAL | Status: AC

## 2019-10-28 MED ORDER — KETOROLAC TROMETHAMINE 30 MG/ML IJ SOLN
INTRAMUSCULAR | Status: AC
  Filled 2019-10-28: qty 1

## 2019-10-28 MED ORDER — CHLORHEXIDINE GLUCONATE 0.12 % MT SOLN: 15.0000 mL | Freq: Once | OROMUCOSAL | Status: AC

## 2019-10-28 MED ORDER — FENTANYL CITRATE (PF) 100 MCG/2ML IJ SOLN
INTRAMUSCULAR | Status: DC | PRN
Start: 2019-10-28 — End: 2019-10-28
  Administered 2019-10-28 (×2): 50 ug via INTRAVENOUS

## 2019-10-28 MED ORDER — NEOMYCIN-POLYMYXIN B GU 40-200000 IR SOLN
Status: AC
  Filled 2019-10-28: qty 2

## 2019-10-28 MED ORDER — CEFAZOLIN SODIUM-DEXTROSE 2-4 GM/100ML-% IV SOLN
2.0000 g | INTRAVENOUS | Status: AC
  Administered 2019-10-28: 2 g via INTRAVENOUS

## 2019-10-28 MED ORDER — OXYCODONE HCL 5 MG/5ML PO SOLN: 5.0000 mg | Freq: Once | ORAL | Status: AC | PRN

## 2019-10-28 MED ORDER — OXYCODONE HCL 5 MG PO TABS
ORAL_TABLET | ORAL | Status: AC
  Administered 2019-10-28: 5 mg via ORAL
  Filled 2019-10-28: qty 1

## 2019-10-28 MED ORDER — OXYCODONE-ACETAMINOPHEN 5-325 MG PO TABS: 1.0000 | ORAL_TABLET | Freq: Four times a day (QID) | ORAL | 0 refills | Status: DC | PRN

## 2019-10-28 MED ORDER — ONDANSETRON HCL 4 MG/2ML IJ SOLN
INTRAMUSCULAR | Status: AC
  Filled 2019-10-28: qty 2

## 2019-10-28 MED ORDER — ONDANSETRON HCL 4 MG/2ML IJ SOLN: 4.0000 mg | Freq: Four times a day (QID) | INTRAMUSCULAR | Status: DC | PRN

## 2019-10-28 MED ORDER — FENTANYL CITRATE (PF) 100 MCG/2ML IJ SOLN
INTRAMUSCULAR | Status: AC
  Filled 2019-10-28: qty 2

## 2019-10-28 MED ORDER — OXYCODONE HCL 5 MG PO TABS: 5.0000 mg | ORAL_TABLET | Freq: Once | ORAL | Status: AC | PRN

## 2019-10-28 SURGICAL SUPPLY — 42 items
APL PRP STRL LF DISP 70% ISPRP (MISCELLANEOUS) ×1
BIT DRILL 2 FAST STEP (BIT) ×3 IMPLANT
BIT DRILL 2.5X4 QC (BIT) ×3 IMPLANT
BNDG ELASTIC 4X5.8 VLCR STR LF (GAUZE/BANDAGES/DRESSINGS) ×3 IMPLANT
CANISTER SUCT 1200ML W/VALVE (MISCELLANEOUS) ×3 IMPLANT
CHLORAPREP W/TINT 26 (MISCELLANEOUS) ×3 IMPLANT
COVER WAND RF STERILE (DRAPES) ×3 IMPLANT
CUFF TOURN SGL QUICK 18X4 (TOURNIQUET CUFF) IMPLANT
DRAPE FLUOR MINI C-ARM 54X84 (DRAPES) ×3 IMPLANT
ELECT REM PT RETURN 9FT ADLT (ELECTROSURGICAL) ×3
ELECTRODE REM PT RTRN 9FT ADLT (ELECTROSURGICAL) ×1 IMPLANT
GAUZE SPONGE 4X4 12PLY STRL (GAUZE/BANDAGES/DRESSINGS) ×3 IMPLANT
GAUZE XEROFORM 1X8 LF (GAUZE/BANDAGES/DRESSINGS) ×6 IMPLANT
GLOVE SURG SYN 9.0  PF PI (GLOVE) ×2
GLOVE SURG SYN 9.0 PF PI (GLOVE) ×1 IMPLANT
GOWN SRG 2XL LVL 4 RGLN SLV (GOWNS) ×1 IMPLANT
GOWN STRL NON-REIN 2XL LVL4 (GOWNS) ×3
GOWN STRL REUS W/ TWL LRG LVL3 (GOWN DISPOSABLE) ×1 IMPLANT
GOWN STRL REUS W/TWL LRG LVL3 (GOWN DISPOSABLE) ×3
K-WIRE 1.6 (WIRE) ×6
K-WIRE FX5X1.6XNS BN SS (WIRE) ×2
KIT TURNOVER KIT A (KITS) ×3 IMPLANT
KWIRE FX5X1.6XNS BN SS (WIRE) ×2 IMPLANT
NEEDLE FILTER BLUNT 18X 1/2SAF (NEEDLE) ×2
NEEDLE FILTER BLUNT 18X1 1/2 (NEEDLE) ×1 IMPLANT
NS IRRIG 500ML POUR BTL (IV SOLUTION) ×3 IMPLANT
PACK EXTREMITY (MISCELLANEOUS) ×3 IMPLANT
PAD CAST CTTN 4X4 STRL (SOFTGOODS) ×2 IMPLANT
PADDING CAST COTTON 4X4 STRL (SOFTGOODS) ×6
PEG SUBCHONDRAL SMOOTH 2.0X16 (Peg) ×6 IMPLANT
PEG SUBCHONDRAL SMOOTH 2.0X18 (Peg) ×3 IMPLANT
PEG SUBCHONDRAL SMOOTH 2.0X20 (Peg) ×3 IMPLANT
PEG SUBCHONDRAL SMOOTH 2.0X22 (Peg) ×6 IMPLANT
PEG SUBCHONDRAL SMOOTH 2.0X24 (Peg) ×3 IMPLANT
SCALPEL PROTECTED #15 DISP (BLADE) ×6 IMPLANT
SCREW CORT 3.5X14 LNG (Screw) ×6 IMPLANT
SPLINT CAST 1 STEP 3X12 (MISCELLANEOUS) ×3 IMPLANT
SUT ETHILON 4-0 (SUTURE) ×3
SUT ETHILON 4-0 FS2 18XMFL BLK (SUTURE) ×1
SUT VICRYL 3-0 27IN (SUTURE) ×3 IMPLANT
SUTURE ETHLN 4-0 FS2 18XMF BLK (SUTURE) ×1 IMPLANT
SYR 3ML LL SCALE MARK (SYRINGE) ×3 IMPLANT

## 2019-10-28 NOTE — OR Nursing (Signed)
Dr Rosita Kea in to see patient gave instructions to patient and family with interpreter at bedside. Patient verbalized understanding.

## 2019-10-28 NOTE — Anesthesia Procedure Notes (Signed)
Procedure Name: LMA Insertion Performed by: Pravin Perezperez, CRNA Pre-anesthesia Checklist: Patient identified, Patient being monitored, Timeout performed, Emergency Drugs available and Suction available Patient Re-evaluated:Patient Re-evaluated prior to induction Oxygen Delivery Method: Circle system utilized Preoxygenation: Pre-oxygenation with 100% oxygen Induction Type: IV induction Ventilation: Mask ventilation without difficulty LMA: LMA inserted LMA Size: 4.0 Tube type: Oral Number of attempts: 1 Placement Confirmation: positive ETCO2 and breath sounds checked- equal and bilateral Tube secured with: Tape Dental Injury: Teeth and Oropharynx as per pre-operative assessment        

## 2019-10-28 NOTE — Anesthesia Postprocedure Evaluation (Signed)
Anesthesia Post Note  Patient: Reginald Parks  Procedure(s) Performed: OPEN REDUCTION INTERNAL FIXATION (ORIF) DISTAL RADIAL FRACTURE (Right )  Patient location during evaluation: PACU Anesthesia Type: General Level of consciousness: awake and alert Pain management: pain level controlled Vital Signs Assessment: post-procedure vital signs reviewed and stable Respiratory status: spontaneous breathing, nonlabored ventilation and respiratory function stable Cardiovascular status: blood pressure returned to baseline and stable Postop Assessment: no apparent nausea or vomiting Anesthetic complications: no   No complications documented.   Last Vitals:  Vitals:   10/28/19 1248 10/28/19 1303  BP: (!) 143/92 (!) 143/84  Pulse: 66 69  Resp: 13 13  Temp:  (!) 36.1 C  SpO2: 94% 95%    Last Pain:  Vitals:   10/28/19 1303  TempSrc:   PainSc: 4                  Karleen Hampshire

## 2019-10-28 NOTE — Discharge Instructions (Addendum)
AMBULATORY SURGERY  DISCHARGE INSTRUCTIONS   1) The drugs that you were given will stay in your system until tomorrow so for the next 24 hours you should not:  A) Drive an automobile B) Make any legal decisions C) Drink any alcoholic beverage   2) You may resume regular meals tomorrow.  Today it is better to start with liquids and gradually work up to solid foods.  You may eat anything you prefer, but it is better to start with liquids, then soup and crackers, and gradually work up to solid foods.   3) Please notify your doctor immediately if you have any unusual bleeding, trouble breathing, redness and pain at the surgery site, drainage, fever, or pain not relieved by medication.    4) Additional Instructions:        Please contact your physician with any problems or Same Day Surgery at [phone], Monday through Friday 6 am to 4 pm, or [place] at [facility] number at [phone].Wrist Fracture Treated With ORIF, Care After This sheet gives you information about how to care for yourself after your procedure. Your health care provider may also give you more specific instructions. If you have problems or questions, contact your health care provider. What can I expect after the procedure? After the procedure, it is common to have:  Pain.  Swelling.  Stiffness.  A small amount of drainage from the incision. Follow these instructions at home: If you have a cast:  Do not stick anything inside the cast to scratch your skin. Doing that increases your risk of infection.  Check the skin around the cast every day. Tell your health care provider about any concerns.  You may put lotion on dry skin around the edges of the cast. Do not put lotion on the skin underneath the cast.  Keep the cast clean and dry. If you have a splint or sling:  Wear the splint or sling as told by your health care provider. Remove it only as told by your health care provider.  Loosen the  splint or sling if your fingers tingle, become numb, or turn cold and blue.  Keep the splint or sling clean and dry. Bathing  Do not take baths, swim, or use a hot tub until your health care provider approves. Ask your health care provider if you may take showers. You may only be allowed to take sponge baths.  If your cast, splint, or sling is not waterproof: ? Do not let it get wet. ? Cover it with a watertight covering when you take a bath or shower.  If you have a sling, remove it for bathing only if your health care provider tells you that it is safe to do so.  Keep the bandage (dressing) dry until your health care provider says it can be removed. Incision care   Follow instructions from your health care provider about how to take care of your incision. Make sure you: ? Wash your hands with soap and water before you change your bandage (dressing). If soap and water are not available, use hand sanitizer. ? Change your dressing as told by your health care provider. ? Leave stitches (sutures), skin glue, or adhesive strips in place. These skin closures may need to stay in place for 2 weeks or longer. If adhesive strip edges start to loosen and curl up, you may trim the loose edges. Do not remove adhesive strips completely unless your health care provider tells you to do that.  Check your incision area every day for signs of infection. Check for: ? Redness. ? More swelling or pain. ? Blood or more fluid. ? Warmth. ? Pus or a bad smell. Managing pain, stiffness, and swelling   If directed, put ice on the injured area. ? If you have a removable splint or sling, remove it as told by your health care provider. ? Put ice in a plastic bag. ? Place a towel between your skin and the bag or between your cast and the bag. ? Leave the ice on for 20 minutes, 2-3 times a day.  Move your fingers often to avoid stiffness and to lessen swelling.  Raise (elevate) the injured area above the  level of your heart while you are sitting or lying down. Driving  Do not drive or use heavy machinery while taking prescription pain medicine.  Do not drive for 24 hours if you were given a sedative during your procedure.  Ask your health care provider when it is safe to drive if you have a cast, splint, or sling on your wrist. Activity  Return to your normal activities as told by your health care provider. Ask your health care provider what activities are safe for you.  Do exercises as told by your health care provider.  Do not lift with your injured wrist until your health care provider approves.  Avoid pulling and pushing. General instructions  Do not put pressure on any part of the cast or splint until it is fully hardened. This may take several hours.  Take over-the-counter and prescription medicines only as told by your health care provider.  Do not use any products that contain nicotine or tobacco, such as cigarettes and e-cigarettes. These can delay bone healing after surgery. If you need help quitting, ask your health care provider.  If you were prescribed pain medicine, take steps to prevent or treat constipation. Your health care provider may recommend that you: ? Drink enough fluid to keep your urine pale yellow. ? Take over-the-counter or prescription medicines. ? Eat foods that are high in fiber, such as beans, whole grains, and fresh fruits and vegetables. ? Limit foods that are high in fat and processed sugars, such as fried or sweet foods.  Keep all follow-up visits as told by your health care provider. This is important. Contact a health care provider if:  Your cast, splint, or sling is damaged or loose.  Your pain is not controlled with medicine.  You have a fever.  You have redness around your incision.  You have more swelling or pain around your incision.  You have blood or more fluid coming from your incision.  Your incision feels warm to the  touch.  You have pus or a bad smell coming from your incision or your dressing.  You develop a rash. Get help right away if:  Your skin or fingers on your injured arm turn blue or gray.  Your arm feels cold or numb.  You have severe pain in your injured wrist.  You have trouble breathing.  You feel faint or light-headed. Summary  After the procedure, it is common to have pain, swelling, stiffness, and a small amount of drainage from the incision.  You may use ice, elevation, and pain medicine as told by your health care provider to lessen pain and swelling.  Wear your splint or sling as told by your health care provider.  Do not lift with your injured wrist until your health care provider  approves. This information is not intended to replace advice given to you by your health care provider. Make sure you discuss any questions you have with your health care provider. Document Revised: 06/05/2017 Document Reviewed: 06/05/2017 Elsevier Patient Education  2020 [employer].
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
## 2019-10-28 NOTE — Transfer of Care (Signed)
Immediate Anesthesia Transfer of Care Note  Patient: Reginald Parks  Procedure(s) Performed: OPEN REDUCTION INTERNAL FIXATION (ORIF) DISTAL RADIAL FRACTURE (Right )  Patient Location: PACU  Anesthesia Type:General  Level of Consciousness: awake  Airway & Oxygen Therapy: Patient Spontanous Breathing and Patient connected to face mask oxygen  Post-op Assessment: Report given to RN and Post -op Vital signs reviewed and stable  Post vital signs: Reviewed  Last Vitals:  Vitals Value Taken Time  BP    Temp    Pulse    Resp    SpO2      Last Pain:  Vitals:   10/28/19 0918  TempSrc: Tympanic  PainSc: 3          Complications: No complications documented.

## 2019-10-28 NOTE — Op Note (Signed)
10/28/2019  11:45 AM  PATIENT:  Reginald Parks  49 y.o. male  PRE-OPERATIVE DIAGNOSIS:  Displaced fracture of distal end of right radius S52.501A Laceration of right palm, initial encounter S61.411A  POST-OPERATIVE DIAGNOSIS:  Displaced fracture of distal end of right radius S52.501A  PROCEDURE:  Procedure(s): OPEN REDUCTION INTERNAL FIXATION (ORIF) DISTAL RADIAL FRACTURE (Right)  SURGEON: Leitha Schuller, MD  ASSISTANTS: None  ANESTHESIA:   general  EBL:  Total I/O In: 600 [I.V.:500; IV Piggyback:100] Out: -   BLOOD ADMINISTERED:none  DRAINS: none   LOCAL MEDICATIONS USED:  NONE  SPECIMEN:  No Specimen  DISPOSITION OF SPECIMEN:  N/A  COUNTS:  YES  TOURNIQUET:   Total Tourniquet Time Documented: Forearm (Right) - 25 minutes Total: Forearm (Right) - 25 minutes   IMPLANTS: [employer] DVR short standard width plate with multiple screws and smooth pegs  DICTATION: .Dragon Dictation patient was brought the operating room and after adequate general anesthesia was obtained the right arm was prepped and draped in usual sterile fashion.  After patient identification and timeout procedures were completed tourniquet was raised.  Volar approach was made centered over the FCR tendon.  The tendon sheath was incised and the tendon retracted radially to protect the radial artery and associated veins.  The pronator had been mostly pulled off the bone but from the fracture and was stripped off the more proximal fragment.  With fingertrap traction having been applied at the start of the case a Freer elevator was required to get the fracture brought forward and a K wire was inserted from the dorsum to maintain this reduction down the shaft and at the close of the case was removed.  Volar plate was then applied with a distal first approach with smooth pegs being placed.  After these been placed the shaft was brought down to the plate and 3 cortical screws placed.  Traction was removed and the  K wire down the shaft was removed and anatomic reduction on the lateral view with restoration of volar tilt radial inclination and no penetration of the joint.  On the volar view there is slight minimal displacement of the radial styloid but otherwise it was stable.  The wound was thoroughly irrigated and tourniquet let down.  Wound was closed with 3-0 Vicryl subcutaneously 4-0 nylon for the skin Xeroform 4 x 4's volar splint and Ace wrap applied  PLAN OF CARE: Discharge to home after PACU  PATIENT DISPOSITION:  PACU - hemodynamically stable.

## 2019-10-28 NOTE — Anesthesia Preprocedure Evaluation (Addendum)
Anesthesia Evaluation  Patient identified by MRN, date of birth, ID band Patient awake    Reviewed: Allergy & Precautions, H&P , NPO status , Patient's Chart, lab work & pertinent test results  History of Anesthesia Complications Negative for: history of anesthetic complications  Airway Mallampati: III  TM Distance: >3 FB Neck ROM: full    Dental  (+) Teeth Intact   Pulmonary neg pulmonary ROS, neg sleep apnea, neg COPD,    breath sounds clear to auscultation       Cardiovascular (-) angina(-) Past MI and (-) Cardiac Stents negative cardio ROS  (-) dysrhythmias  Rhythm:regular Rate:Normal     Neuro/Psych negative neurological ROS  negative psych ROS   GI/Hepatic negative GI ROS, Neg liver ROS,   Endo/Other  negative endocrine ROS  Renal/GU      Musculoskeletal   Abdominal   Peds  Hematology negative hematology ROS (+)   Anesthesia Other Findings No past medical history on file.  Past Surgical History: 10/27/2017: OPEN REDUCTION INTERNAL FIXATION (ORIF) HAND; Right     Comment:  Procedure: OPEN REDUCTION INTERNAL FIXATION (ORIF)               -RIGHT THUMB;  Surgeon: Christena Flake, MD;  Location:               [facility] ORS;  Service: Orthopedics;  Laterality: Right;     Reproductive/Obstetrics negative OB ROS                            Anesthesia Physical Anesthesia Plan  ASA: II  Anesthesia Plan: General LMA   Post-op Pain Management:    Induction:   PONV Risk Score and Plan: Ondansetron, Dexamethasone, Midazolam and Treatment may vary due to age or medical condition  Airway Management Planned:   Additional Equipment:   Intra-op Plan:   Post-operative Plan:   Informed Consent: I have reviewed the patients History and Physical, chart, labs and discussed the procedure including the risks, benefits and alternatives for the proposed anesthesia with the patient or authorized  representative who has indicated his/her understanding and acceptance.     Dental Advisory Given  Plan Discussed with: Anesthesiologist, CRNA and Surgeon  Anesthesia Plan Comments:       Anesthesia Quick Evaluation

## 2019-10-29 ENCOUNTER — Encounter: Payer: Self-pay | Admitting: Orthopedic Surgery
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
